# Patient Record
Sex: Female | Born: 1985 | Race: Black or African American | Hispanic: No | Marital: Single | Smoking: Never smoker
Health system: Southern US, Community
[De-identification: ages and names within clinical notes are randomized; demographics above are authoritative.]

## PROBLEM LIST (undated history)

## (undated) DIAGNOSIS — E119 Type 2 diabetes mellitus without complications: Secondary | ICD-10-CM

---

## 2011-07-14 ENCOUNTER — Emergency Department: Payer: Self-pay | Admitting: *Deleted

## 2011-07-14 LAB — COMPREHENSIVE METABOLIC PANEL
Alkaline Phosphatase: 50 U/L (ref 50–136)
Bilirubin,Total: 0.7 mg/dL (ref 0.2–1.0)
Calcium, Total: 8.8 mg/dL (ref 8.5–10.1)
Chloride: 103 mmol/L (ref 98–107)
Co2: 25 mmol/L (ref 21–32)
Creatinine: 0.59 mg/dL — ABNORMAL LOW (ref 0.60–1.30)
EGFR (Non-African Amer.): >60
Osmolality: 288 (ref 275–301)
Potassium: 3.7 mmol/L (ref 3.5–5.1)
SGOT(AST): 12 U/L — ABNORMAL LOW (ref 15–37)
SGPT (ALT): 28 U/L
Sodium: 140 mmol/L (ref 136–145)
Total Protein: 7.5 g/dL (ref 6.4–8.2)

## 2012-04-05 ENCOUNTER — Emergency Department: Payer: Self-pay | Admitting: Emergency Medicine

## 2012-04-11 ENCOUNTER — Emergency Department: Payer: Self-pay | Admitting: Internal Medicine

## 2012-05-29 ENCOUNTER — Emergency Department: Payer: Self-pay | Admitting: Emergency Medicine

## 2013-09-07 ENCOUNTER — Emergency Department: Payer: Self-pay | Admitting: Emergency Medicine

## 2013-09-09 ENCOUNTER — Emergency Department: Payer: Self-pay | Admitting: Emergency Medicine

## 2013-11-08 IMAGING — CR DG CHEST 2V
1 series · 2 of 2 positions shown · non-contrast
Comparison: none

REASON FOR EXAM: short of breath
COMMENTS:   LMP: Now

[Series 1: pa · 0.17mm/px · 2 of 2 slices shown]
[im 1/2]
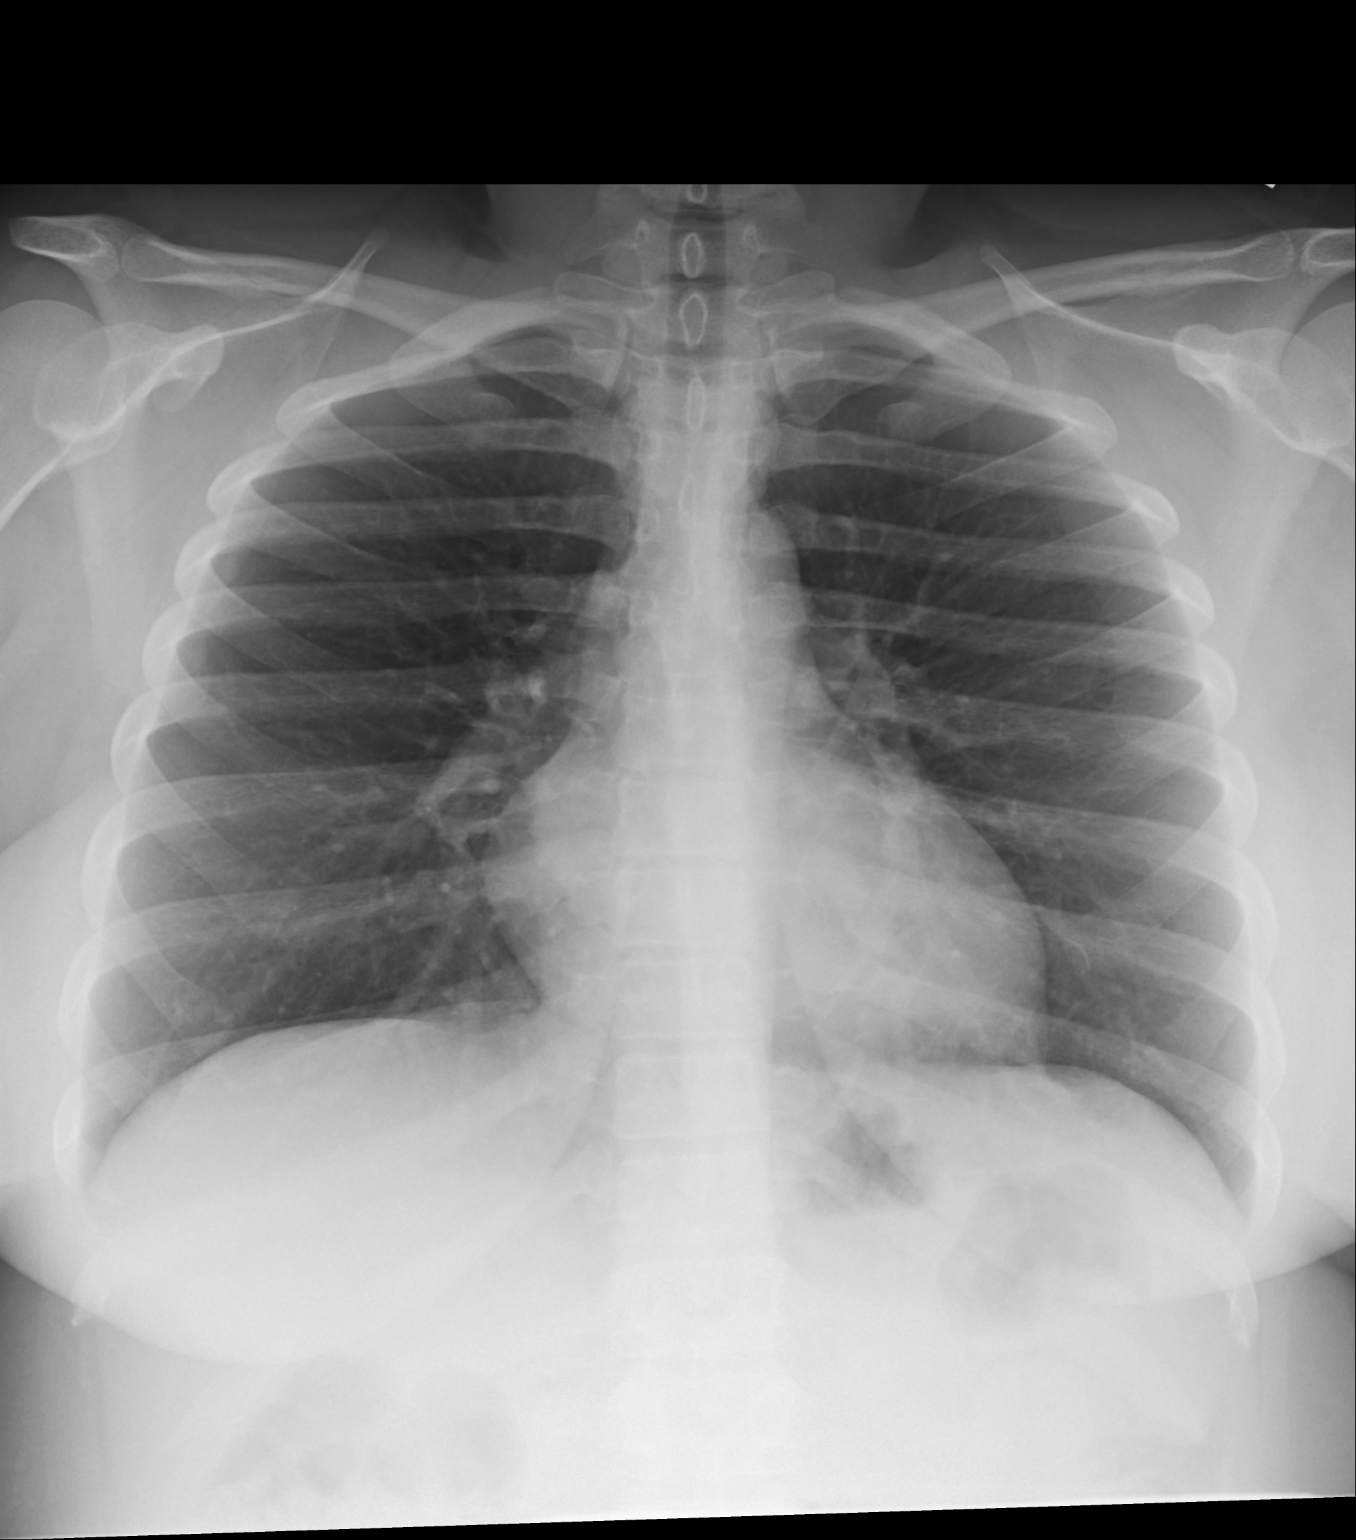
[im 2/2]
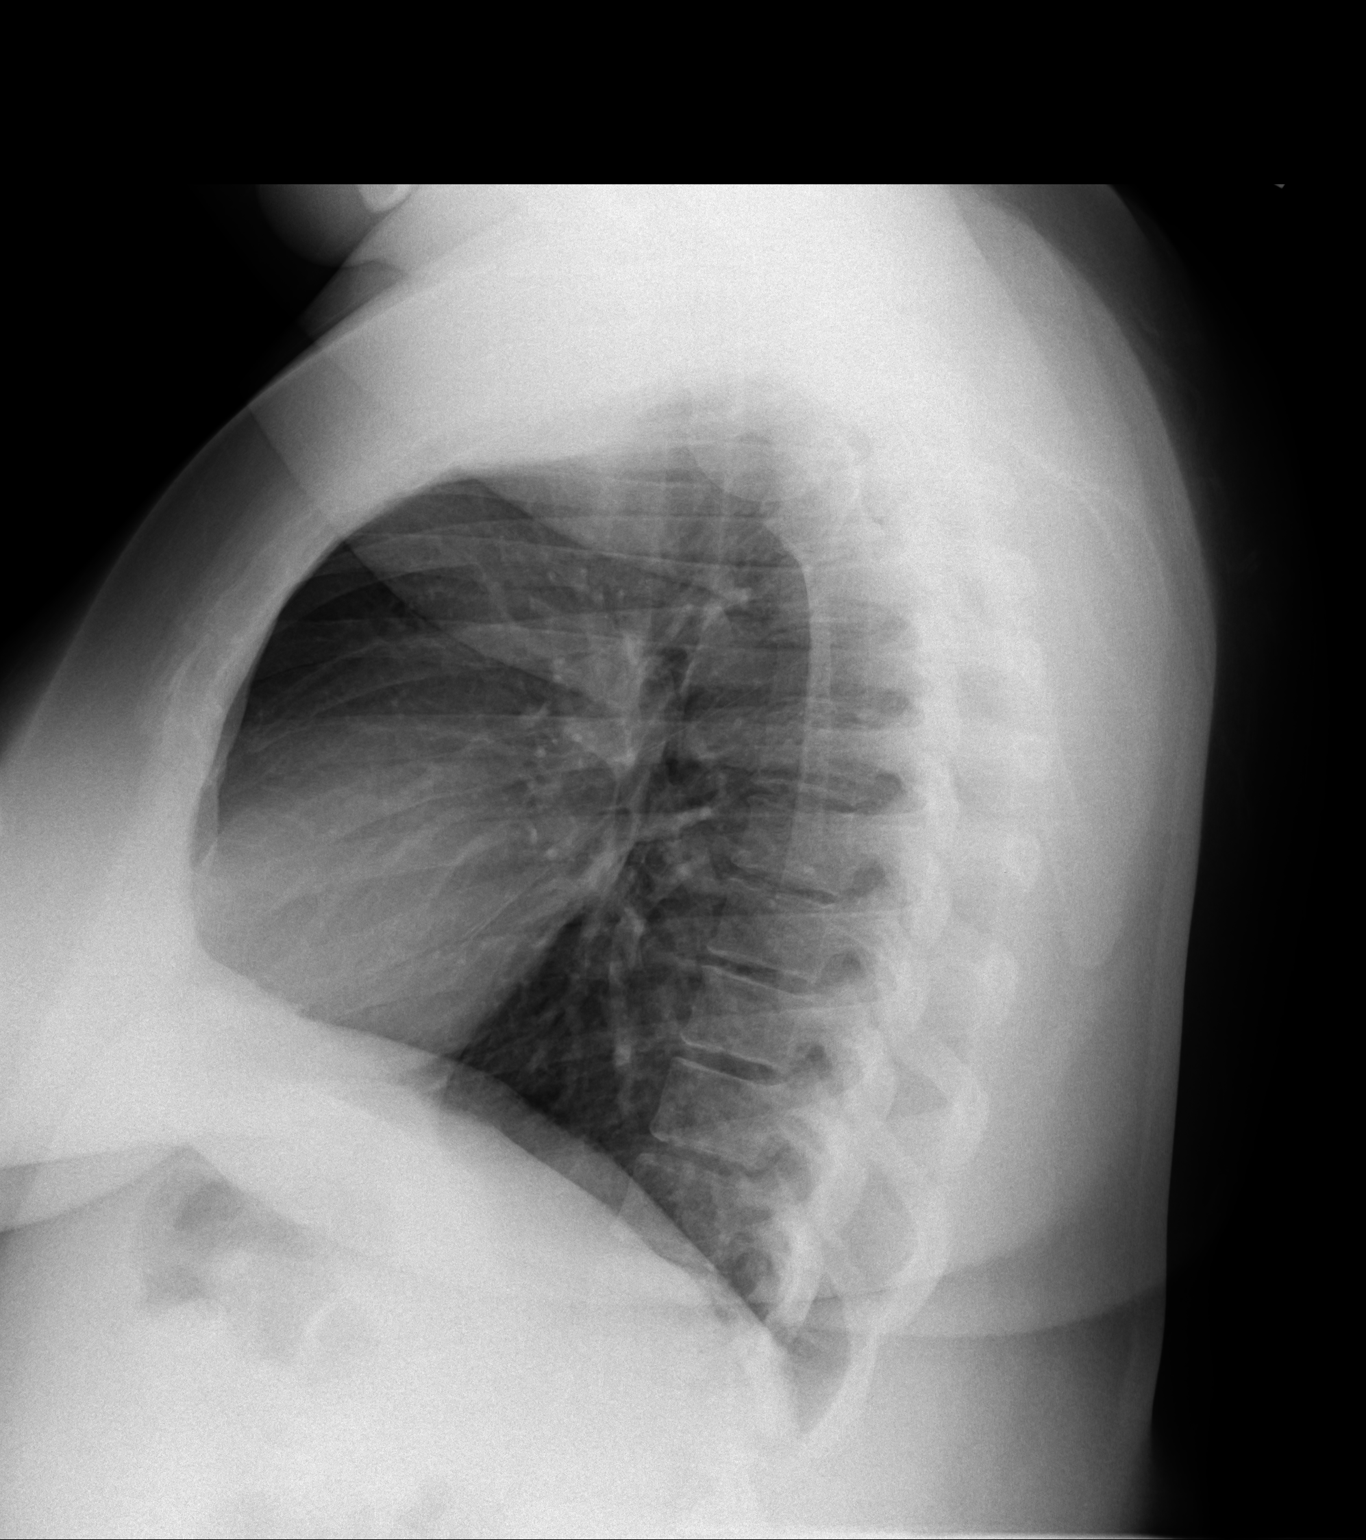

[2 of 2 positions shown; findings below may reference images not displayed]

PROCEDURE:     DXR - DXR CHEST PA (OR AP) AND LATERAL  - April 05, 2012  [DATE]

RESULT:     The lungs are hyperinflated. The lungs are clear. The heart and
pulmonary vessels are normal. The bony and mediastinal structures are
unremarkable. There is no effusion. There is no pneumothorax or evidence of
congestive failure.
IMPRESSION: No acute cardiopulmonary disease.

[REDACTED]

## 2013-12-05 ENCOUNTER — Emergency Department: Payer: Self-pay | Admitting: Emergency Medicine

## 2014-03-11 ENCOUNTER — Emergency Department: Payer: Self-pay | Admitting: Emergency Medicine

## 2014-11-05 ENCOUNTER — Encounter: Payer: Self-pay | Admitting: Urgent Care

## 2014-11-05 DIAGNOSIS — E1165 Type 2 diabetes mellitus with hyperglycemia: Secondary | ICD-10-CM | POA: Insufficient documentation

## 2014-11-05 DIAGNOSIS — Z3202 Encounter for pregnancy test, result negative: Secondary | ICD-10-CM | POA: Insufficient documentation

## 2014-11-05 DIAGNOSIS — Z91040 Latex allergy status: Secondary | ICD-10-CM | POA: Insufficient documentation

## 2014-11-05 DIAGNOSIS — Z9119 Patient's noncompliance with other medical treatment and regimen: Secondary | ICD-10-CM | POA: Insufficient documentation

## 2014-11-05 LAB — GLUCOSE, CAPILLARY
GLUCOSE-CAPILLARY: 323 mg/dL — AB (ref 65–99)
Glucose-Capillary: 304 mg/dL — ABNORMAL HIGH (ref 65–99)

## 2014-11-05 LAB — POCT PREGNANCY, URINE: Preg Test, Ur: NEGATIVE

## 2014-11-05 NOTE — ED Notes (Addendum)
Patient presents with c/o FSBS being > 500. (+) vision changes and diaphoresis reported. Patient does not have insurance - has been off of Glipizide for over a year. Patient is going to require some home assistance for T2DM management - patient cannot perform fingerstick in triage...Hannah Huff.needs education.

## 2014-11-06 ENCOUNTER — Emergency Department
Admission: EM | Admit: 2014-11-06 | Discharge: 2014-11-06 | Disposition: A | Discharge status: Home / Self Care | Payer: Self-pay | Attending: Emergency Medicine | Admitting: Emergency Medicine

## 2014-11-06 DIAGNOSIS — E1165 Type 2 diabetes mellitus with hyperglycemia: Secondary | ICD-10-CM

## 2014-11-06 HISTORY — DX: Type 2 diabetes mellitus without complications: E11.9

## 2014-11-06 LAB — COMPREHENSIVE METABOLIC PANEL
ALBUMIN: 4 g/dL (ref 3.5–5.0)
ALT: 15 U/L (ref 14–54)
ANION GAP: 11 (ref 5–15)
AST: 21 U/L (ref 15–41)
Alkaline Phosphatase: 52 U/L (ref 38–126)
BUN: 10 mg/dL (ref 6–20)
CALCIUM: 9.4 mg/dL (ref 8.9–10.3)
CO2: 25 mmol/L (ref 22–32)
CREATININE: 0.57 mg/dL (ref 0.44–1.00)
Chloride: 101 mmol/L (ref 101–111)
GFR calc Af Amer: >60 mL/min (ref >60)
GFR calc non Af Amer: >60 mL/min (ref >60)
Glucose, Bld: 358 mg/dL — ABNORMAL HIGH (ref 65–99)
Potassium: 3.9 mmol/L (ref 3.5–5.1)
SODIUM: 137 mmol/L (ref 135–145)
TOTAL PROTEIN: 7.4 g/dL (ref 6.5–8.1)
Total Bilirubin: 1.1 mg/dL (ref 0.3–1.2)

## 2014-11-06 LAB — URINALYSIS COMPLETE WITH MICROSCOPIC (ARMC ONLY)
Bilirubin Urine: NEGATIVE
Ketones, ur: NEGATIVE mg/dL
Leukocytes, UA: NEGATIVE
NITRITE: POSITIVE — AB
PROTEIN: NEGATIVE mg/dL
Specific Gravity, Urine: 1.035 — ABNORMAL HIGH (ref 1.005–1.030)
pH: 5 (ref 5.0–8.0)

## 2014-11-06 LAB — GLUCOSE, CAPILLARY
Glucose-Capillary: 349 mg/dL — ABNORMAL HIGH (ref 65–99)
Glucose-Capillary: 306 mg/dL — ABNORMAL HIGH (ref 65–99)

## 2014-11-06 MED ORDER — SODIUM CHLORIDE 0.9 % IV BOLUS (SEPSIS)
1000.0000 mL | Freq: Once | INTRAVENOUS | Status: AC
  Administered 2014-11-06: 1000 mL via INTRAVENOUS

## 2014-11-06 MED ORDER — GLIPIZIDE 10 MG PO TABS
5.0000 mg | ORAL_TABLET | Freq: Every day | ORAL | Status: DC
  Administered 2014-11-06: 5 mg via ORAL

## 2014-11-06 MED ORDER — GLIPIZIDE 5 MG PO TABS: 5.0000 mg | ORAL_TABLET | Freq: Every day | ORAL | Status: AC

## 2014-11-06 MED ORDER — GLIPIZIDE 10 MG PO TABS
ORAL_TABLET | ORAL | Status: AC
  Administered 2014-11-06: 5 mg via ORAL
  Filled 2014-11-06: qty 1

## 2014-11-06 NOTE — ED Notes (Signed)
CBG 349 at 0235

## 2014-11-06 NOTE — ED Provider Notes (Signed)
[facility] Emergency Department Provider Note  ____________________________________________  Time seen: 2:30 AM  I have reviewed the triage vital signs and the nursing notes.   HISTORY  Chief Complaint Hyperglycemia      HPI Hannah Huff is a 29 y.o. female presents with hyperglycemia noted at home. Patient cannot recall what the numerical value was. Patient admits to medication noncompliance due to lack of insurance times months. She recalls taking glipizide as her last agent.     Past Medical History  Diagnosis Date  . Diabetes mellitus without complication     There are no active problems to display for this patient.   History reviewed. No pertinent past surgical history.  No current outpatient prescriptions on file.  Allergies Latex and Pineapple  No family history on file.  Social History History  Substance Use Topics  . Smoking status: Never Smoker   . Smokeless tobacco: Not on file  . Alcohol Use: No    Review of Systems  Constitutional: Negative for fever. Eyes: Negative for visual changes. ENT: Negative for sore throat. Cardiovascular: Negative for chest pain. Respiratory: Negative for shortness of breath. Gastrointestinal: Negative for abdominal pain, vomiting and diarrhea. Genitourinary: Negative for dysuria. Musculoskeletal: Negative for back pain. Skin: Negative for rash. Neurological: Negative for headaches, focal weakness or numbness.  10-point ROS otherwise negative.  ____________________________________________   PHYSICAL EXAM:  VITAL SIGNS: ED Triage Vitals  Enc Vitals Group     BP 11/05/14 2252 126/82 mmHg     Pulse Rate 11/05/14 2252 89     Resp 11/05/14 2252 20     Temp 11/05/14 2252 98.6 F (37 C)     Temp Source 11/05/14 2252 Oral     SpO2 11/05/14 2252 100 %     Weight 11/05/14 2252 195 lb (88.451 kg)     Height 11/05/14 2252 4\' 8"  (1.422 m)     Head Cir --      Peak Flow --      Pain  Score 11/05/14 2253 0     Pain Loc --      Pain Edu? --      Excl. in GC? --      Constitutional: Alert and oriented. Well appearing and in no distress. Eyes: Conjunctivae are normal. PERRL. Normal extraocular movements. ENT   Head: Normocephalic and atraumatic.   Nose: No congestion/rhinnorhea.   Mouth/Throat: Mucous membranes are moist.   Neck: No stridor. Hematological/Lymphatic/Immunilogical: No cervical lymphadenopathy. Cardiovascular: Normal rate, regular rhythm. Normal and symmetric distal pulses are present in all extremities. No murmurs, rubs, or gallops. Respiratory: Normal respiratory effort without tachypnea nor retractions. Breath sounds are clear and equal bilaterally. No wheezes/rales/rhonchi. Gastrointestinal: Soft and nontender. No distention. There is no CVA tenderness. Genitourinary: deferred Musculoskeletal: Nontender with normal range of motion in all extremities. No joint effusions.  No lower extremity tenderness nor edema. Neurologic:  Normal speech and language. No gross focal neurologic deficits are appreciated. Speech is normal.  Skin:  Skin is warm, dry and intact. No rash noted. Psychiatric: Mood and affect are normal. Speech and behavior are normal. Patient exhibits appropriate insight and judgment.  ____________________________________________    LABS (pertinent positives/negatives)  Labs Reviewed  GLUCOSE, CAPILLARY - Abnormal; Notable for the following:    Glucose-Capillary 323 (*)    All other components within normal limits  GLUCOSE, CAPILLARY - Abnormal; Notable for the following:    Glucose-Capillary 304 (*)    All other components within normal limits  COMPREHENSIVE METABOLIC PANEL - Abnormal; Notable for the following:    Glucose, Bld 358 (*)    All other components within normal limits  URINALYSIS COMPLETEWITH MICROSCOPIC ([facility])  - Abnormal; Notable for the following:    Color, Urine YELLOW (*)    APPearance HAZY (*)     Glucose, UA >500 (*)    Specific Gravity, Urine 1.035 (*)    Hgb urine dipstick 1+ (*)    Nitrite POSITIVE (*)    Bacteria, UA FEW (*)    Squamous Epithelial / LPF 6-30 (*)    All other components within normal limits  POC URINE PREG, ED  POCT PREGNANCY, URINE  CBG MONITORING, ED         INITIAL IMPRESSION / ASSESSMENT AND PLAN / ED COURSE  Pertinent labs & imaging results that were available during my care of the patient were reviewed by me and considered in my medical decision making (see chart for details).  History of physical exam consistent with hyperglycemia secondary to noncompliance due to lack of insurance. Glucose 358 on CMP. Patient received 2 L normal saline and glipizide 5 mg tablet. We'll discharge patient home with glipizide prescription for 90 days and referred to outpatient primary care physician  ____________________________________________   FINAL CLINICAL IMPRESSION(S) / ED DIAGNOSES  Final diagnoses:  Hyperglycemia due to type 2 diabetes mellitus      Darci Currentandolph N Brown, MD 11/12/14 [phone]

## 2014-11-06 NOTE — ED Notes (Signed)
Pt cant afford glipizide has been off for a year friend told her today her sugar was high because it was high in her urine. The only s/s she had was sweating today and blurred vision which has resolved.

## 2014-11-06 NOTE — Discharge Instructions (Signed)

## 2014-11-06 NOTE — ED Notes (Signed)
Pt alert and in NAD at time of d/c 

## 2016-11-26 ENCOUNTER — Emergency Department: Payer: BC Managed Care – PPO

## 2016-11-26 ENCOUNTER — Emergency Department
Admission: EM | Admit: 2016-11-26 | Discharge: 2016-11-26 | Disposition: A | Discharge status: Home / Self Care | Payer: BC Managed Care – PPO | Attending: Emergency Medicine | Admitting: Emergency Medicine

## 2016-11-26 DIAGNOSIS — Z7984 Long term (current) use of oral hypoglycemic drugs: Secondary | ICD-10-CM | POA: Diagnosis not present

## 2016-11-26 DIAGNOSIS — F419 Anxiety disorder, unspecified: Secondary | ICD-10-CM

## 2016-11-26 DIAGNOSIS — R0600 Dyspnea, unspecified: Secondary | ICD-10-CM | POA: Insufficient documentation

## 2016-11-26 DIAGNOSIS — R0602 Shortness of breath: Secondary | ICD-10-CM

## 2016-11-26 DIAGNOSIS — Z91040 Latex allergy status: Secondary | ICD-10-CM | POA: Diagnosis not present

## 2016-11-26 LAB — BASIC METABOLIC PANEL
ANION GAP: 9 (ref 5–15)
BUN: 12 mg/dL (ref 6–20)
CHLORIDE: 101 mmol/L (ref 101–111)
CO2: 23 mmol/L (ref 22–32)
Calcium: 9.5 mg/dL (ref 8.9–10.3)
Creatinine, Ser: 0.64 mg/dL (ref 0.44–1.00)
GFR calc Af Amer: >60 mL/min (ref >60)
GFR calc non Af Amer: >60 mL/min (ref >60)
Glucose, Bld: 385 mg/dL — ABNORMAL HIGH (ref 65–99)
POTASSIUM: 3.4 mmol/L — AB (ref 3.5–5.1)
Sodium: 133 mmol/L — ABNORMAL LOW (ref 135–145)

## 2016-11-26 LAB — CBC
HCT: 37.9 % (ref 35.0–47.0)
HEMOGLOBIN: 13.1 g/dL (ref 12.0–16.0)
MCH: 30.2 pg (ref 26.0–34.0)
MCHC: 34.7 g/dL (ref 32.0–36.0)
MCV: 87.1 fL (ref 80.0–100.0)
Platelets: 255 10*3/uL (ref 150–440)
RBC: 4.35 MIL/uL (ref 3.80–5.20)
RDW: 13.1 % (ref 11.5–14.5)
WBC: 5.5 10*3/uL (ref 3.6–11.0)

## 2016-11-26 LAB — TROPONIN I: Troponin I: <0.03 ng/mL (ref <0.03)

## 2016-11-26 MED ORDER — LORAZEPAM 1 MG PO TABS
ORAL_TABLET | ORAL | Status: AC
  Administered 2016-11-26: 1 mg via ORAL
  Filled 2016-11-26: qty 1

## 2016-11-26 MED ORDER — LORAZEPAM 0.5 MG PO TABS: 0.5000 mg | ORAL_TABLET | Freq: Three times a day (TID) | ORAL | 0 refills | Status: AC | PRN

## 2016-11-26 MED ORDER — LORAZEPAM 1 MG PO TABS
1.0000 mg | ORAL_TABLET | Freq: Once | ORAL | Status: AC
  Administered 2016-11-26: 1 mg via ORAL

## 2016-11-26 NOTE — ED Notes (Signed)
Attempt by Bernette Redbirdkenny, rn and this rn to initiate iv without success. Matt, rn in to attempt iv start. Pt appears anxious and is tearful. Pt states she has had 3 days of stress at work. Pt states she feels shob and has tightness in chest. Pt states she feels like she has to inspire deeply to have air movement. Clear breath sounds in all lung fields. Pt with one inspiratory wheeze to rul that cleared with another deep inspiration. Skin normal color warm and dry. Pt states she does not take her glipizide as prescribed and was prescribed latuda for anxiety "But I can't take it because I can't function."

## 2016-11-26 NOTE — ED Notes (Signed)
Spoke with dr. Lenard Lancepaduchowski regarding blood sugar of 385. No new orders received.

## 2016-11-26 NOTE — ED Provider Notes (Signed)
[facility] Emergency Department Provider Note  Time seen: 2:20 AM  I have reviewed the triage vital signs and the nursing notes.   HISTORY  Chief Complaint Shortness of Breath    HPI Hannah SnowballShani Victory Huff is a 31 y.o. female with a past medical history of diabetes, anxiety, presents the emergency department with a feeling of difficulty breathing. According to the patient she has a history of anxiety and has had symptoms similar to this in the past but this is the worst. Patient feels like she cannot get a full breath of air. Denies any chest pain but does state a tightness sensation. Denies any underlying lung abnormality such as COPD or asthma. Patient states she has been under a lot of stress over the past 3 days at work. Denies any fever or cough or congestion. Largely negative review of systems.  Past Medical History:  Diagnosis Date  . Diabetes mellitus without complication (HCC)     There are no active problems to display for this patient.   History reviewed. No pertinent surgical history.  Prior to Admission medications   Medication Sig Start Date End Date Taking? Authorizing Provider  glipiZIDE (GLUCOTROL) 5 MG tablet Take 1 tablet (5 mg total) by mouth daily before breakfast. 11/06/14   Darci CurrentBrown, [place] N, MD    Allergies  Allergen Reactions  . Latex   . Pineapple     No family history on file.  Social History Social History  Substance Use Topics  . Smoking status: Never Smoker  . Smokeless tobacco: Never Used  . Alcohol use No    Review of Systems Constitutional: Negative for fever. ENT: Negative for congestion Cardiovascular: Mild chest tightness Respiratory: Positive for shortness of breath Gastrointestinal: Negative for abdominal pain Musculoskeletal: Negative for leg pain or swelling. Skin: Negative for rash. Neurological: Negative for headache All other ROS negative  ____________________________________________   PHYSICAL  EXAM:  VITAL SIGNS: ED Triage Vitals  Enc Vitals Group     BP 11/26/16 0150 (!) 131/53     Pulse Rate 11/26/16 0148 90     Resp 11/26/16 0148 (!) 22     Temp 11/26/16 0150 98.3 F (36.8 C)     Temp Source 11/26/16 0148 Oral     SpO2 11/26/16 0148 100 %     Weight 11/26/16 0144 193 lb (87.5 kg)     Height 11/26/16 0144 4\' 8"  (1.422 m)     Head Circumference --      Peak Flow --      Pain Score 11/26/16 0148 4     Pain Loc --      Pain Edu? --      Excl. in GC? --     Constitutional: Alert and oriented. Well appearing and in no distress. Eyes: Normal exam ENT   Head: Normocephalic and atraumatic.   Mouth/Throat: Mucous membranes are moist. Cardiovascular: Normal rate, regular rhythm. No murmur Respiratory: Mild tachypnea. Patient taking very large breaths in and out. Clear lung sounds bilaterally without wheezes rales or rhonchi. Good air movement. Gastrointestinal: Soft and nontender. No distention.   Musculoskeletal: Nontender with normal range of motion in all extremities. No lower extremity tenderness or edema.  Neurologic:  Normal speech and language. No gross focal neurologic deficits Skin:  Skin is warm, dry and intact.  Psychiatric: Mood and affect are normal. Speech and behavior are normal.   ____________________________________________    EKG  EKG reviewed and interpreted by massage shows normal sinus rhythm  at 93 bpm, narrow QRS, normal axis, normal intervals, no ST changes. Reassuring EKG.  ____________________________________________    RADIOLOGY  X-ray negative  ____________________________________________   INITIAL IMPRESSION / ASSESSMENT AND PLAN / ED COURSE  Pertinent labs & imaging results that were available during my care of the patient were reviewed by me and considered in my medical decision making (see chart for details).  Patient presents to the emergency department with shortness of breath. Patient's symptoms are very suggestive of  anxiety, she appears anxious during evaluation. Patient has normal vitals including a 100% room air saturation with clear lung sounds and a normal physical exam. Patient states a history of anxiety in the past but states it has never been this bad previously. Her last period was approximately one month ago she is not sexually active with males and denies possibility of pregnancy. We will dose at one time dose of oral Ativan and monitor for improvement.   The patient's workup as shown largely normal lab results besides hyperglycemia. Patient's troponin is negative. Chest x-ray is clear. EKG is reassuring. After Ativan the patient has calm, no distress. States her breathing difficulty is gone. Highly suspect anxiety reaction.  ____________________________________________   FINAL CLINICAL IMPRESSION(S) / ED DIAGNOSES  Dyspnea Anxiety   Minna Antis, MD 11/26/16 [phone]

## 2016-11-26 NOTE — ED Notes (Signed)
Pt assisted up to commode for urination. md states to hold pt until 0740 for discharge due to ativan administration.

## 2016-11-26 NOTE — ED Notes (Signed)
Pt is not able to secure a ride at this time. Pt to continue to attempt to find ride. Pt to wait in room, charge aware.

## 2016-11-26 NOTE — ED Notes (Signed)
Pt sleeping. resps unlabored.  

## 2016-11-26 NOTE — ED Notes (Signed)
Report to felicia, rn.  

## 2016-11-26 NOTE — ED Notes (Signed)
Pt sleeping. 

## 2016-11-26 NOTE — ED Notes (Signed)
Pt. Tearful in room.  Pt. States she was dx with bipolar and is struggling with it.  Pt. States she was suicidal a couple of years ago.  Pt. Denies SI or HI at this time.  Pt. States she has been under a lot of stress the past couple days.

## 2016-11-26 NOTE — ED Notes (Signed)
Pt ambulating without difficulty.

## 2016-11-26 NOTE — Discharge Instructions (Signed)
Please follow-up with her primary care doctor this coming week for recheck/reevaluation. Return to the emergency department for any chest pain, return if shortness of breath, or any other symptom personally concerning to yourself.
